# Patient Record
Sex: Female | Born: 1945 | Race: White | Hispanic: No | Marital: Married | Smoking: Former smoker
Health system: Southern US, Community
[De-identification: ages and names within clinical notes are randomized; demographics above are authoritative.]

---

## 2014-12-04 DIAGNOSIS — H353290 Exudative age-related macular degeneration, unspecified eye, stage unspecified: Secondary | ICD-10-CM | POA: Insufficient documentation

## 2014-12-04 DIAGNOSIS — H3530 Unspecified macular degeneration: Secondary | ICD-10-CM | POA: Insufficient documentation

## 2014-12-04 DIAGNOSIS — H35059 Retinal neovascularization, unspecified, unspecified eye: Secondary | ICD-10-CM | POA: Insufficient documentation

## 2015-08-04 DIAGNOSIS — Z85118 Personal history of other malignant neoplasm of bronchus and lung: Secondary | ICD-10-CM | POA: Insufficient documentation

## 2015-08-04 DIAGNOSIS — Z720 Tobacco use: Secondary | ICD-10-CM | POA: Insufficient documentation

## 2015-08-04 DIAGNOSIS — Z9289 Personal history of other medical treatment: Secondary | ICD-10-CM | POA: Insufficient documentation

## 2015-08-04 DIAGNOSIS — J209 Acute bronchitis, unspecified: Secondary | ICD-10-CM | POA: Insufficient documentation

## 2015-08-04 DIAGNOSIS — I480 Paroxysmal atrial fibrillation: Secondary | ICD-10-CM | POA: Insufficient documentation

## 2015-08-04 DIAGNOSIS — J9611 Chronic respiratory failure with hypoxia: Secondary | ICD-10-CM | POA: Insufficient documentation

## 2015-08-04 DIAGNOSIS — I509 Heart failure, unspecified: Secondary | ICD-10-CM | POA: Insufficient documentation

## 2015-08-04 DIAGNOSIS — R7989 Other specified abnormal findings of blood chemistry: Secondary | ICD-10-CM | POA: Insufficient documentation

## 2015-08-04 DIAGNOSIS — A419 Sepsis, unspecified organism: Secondary | ICD-10-CM | POA: Insufficient documentation

## 2015-08-05 DIAGNOSIS — J441 Chronic obstructive pulmonary disease with (acute) exacerbation: Secondary | ICD-10-CM | POA: Insufficient documentation

## 2015-08-05 DIAGNOSIS — E039 Hypothyroidism, unspecified: Secondary | ICD-10-CM | POA: Insufficient documentation

## 2015-08-05 DIAGNOSIS — J449 Chronic obstructive pulmonary disease, unspecified: Secondary | ICD-10-CM | POA: Insufficient documentation

## 2015-08-10 DIAGNOSIS — R197 Diarrhea, unspecified: Secondary | ICD-10-CM | POA: Insufficient documentation

## 2015-08-10 DIAGNOSIS — R531 Weakness: Secondary | ICD-10-CM | POA: Insufficient documentation

## 2015-08-10 DIAGNOSIS — E860 Dehydration: Secondary | ICD-10-CM | POA: Insufficient documentation

## 2015-08-10 DIAGNOSIS — B370 Candidal stomatitis: Secondary | ICD-10-CM | POA: Insufficient documentation

## 2015-08-10 DIAGNOSIS — J9621 Acute and chronic respiratory failure with hypoxia: Secondary | ICD-10-CM | POA: Insufficient documentation

## 2015-08-10 DIAGNOSIS — R110 Nausea: Secondary | ICD-10-CM | POA: Insufficient documentation

## 2015-08-15 DIAGNOSIS — J441 Chronic obstructive pulmonary disease with (acute) exacerbation: Secondary | ICD-10-CM | POA: Insufficient documentation

## 2015-08-16 DIAGNOSIS — J111 Influenza due to unidentified influenza virus with other respiratory manifestations: Secondary | ICD-10-CM | POA: Insufficient documentation

## 2015-08-25 DIAGNOSIS — K5901 Slow transit constipation: Secondary | ICD-10-CM | POA: Insufficient documentation

## 2015-08-25 DIAGNOSIS — N179 Acute kidney failure, unspecified: Secondary | ICD-10-CM | POA: Insufficient documentation

## 2015-09-16 DIAGNOSIS — I480 Paroxysmal atrial fibrillation: Secondary | ICD-10-CM | POA: Insufficient documentation

## 2015-10-31 DIAGNOSIS — Z9981 Dependence on supplemental oxygen: Secondary | ICD-10-CM | POA: Insufficient documentation

## 2015-11-05 DIAGNOSIS — C3490 Malignant neoplasm of unspecified part of unspecified bronchus or lung: Secondary | ICD-10-CM | POA: Insufficient documentation

## 2015-11-05 DIAGNOSIS — E782 Mixed hyperlipidemia: Secondary | ICD-10-CM | POA: Insufficient documentation

## 2015-11-05 DIAGNOSIS — R52 Pain, unspecified: Secondary | ICD-10-CM | POA: Insufficient documentation

## 2015-11-06 DIAGNOSIS — S22000A Wedge compression fracture of unspecified thoracic vertebra, initial encounter for closed fracture: Secondary | ICD-10-CM | POA: Insufficient documentation

## 2015-11-12 ENCOUNTER — Other Ambulatory Visit (HOSPITAL_COMMUNITY): Payer: Self-pay | Admitting: Interventional Radiology

## 2015-11-12 DIAGNOSIS — S22000A Wedge compression fracture of unspecified thoracic vertebra, initial encounter for closed fracture: Secondary | ICD-10-CM

## 2015-12-05 DIAGNOSIS — M549 Dorsalgia, unspecified: Secondary | ICD-10-CM | POA: Insufficient documentation

## 2015-12-05 DIAGNOSIS — S22080A Wedge compression fracture of T11-T12 vertebra, initial encounter for closed fracture: Secondary | ICD-10-CM | POA: Insufficient documentation

## 2015-12-10 ENCOUNTER — Other Ambulatory Visit: Payer: Self-pay

## 2016-01-02 ENCOUNTER — Other Ambulatory Visit: Payer: Medicare Other

## 2016-01-02 ENCOUNTER — Ambulatory Visit
Admission: RE | Admit: 2016-01-02 | Discharge: 2016-01-02 | Disposition: A | Discharge status: Home / Self Care | Payer: Medicare Other | Source: Ambulatory Visit | Attending: Internal Medicine | Admitting: Internal Medicine

## 2016-01-02 ENCOUNTER — Other Ambulatory Visit: Payer: Self-pay | Admitting: Internal Medicine

## 2016-01-02 ENCOUNTER — Ambulatory Visit
Admission: RE | Admit: 2016-01-02 | Discharge: 2016-01-02 | Disposition: A | Discharge status: Home / Self Care | Payer: Self-pay | Source: Ambulatory Visit | Attending: Interventional Radiology | Admitting: Interventional Radiology

## 2016-01-02 DIAGNOSIS — M5440 Lumbago with sciatica, unspecified side: Secondary | ICD-10-CM

## 2016-01-02 DIAGNOSIS — S22000A Wedge compression fracture of unspecified thoracic vertebra, initial encounter for closed fracture: Secondary | ICD-10-CM

## 2016-01-02 HISTORY — PX: IR GENERIC HISTORICAL: IMG1180011

## 2016-01-02 MED ORDER — IOPAMIDOL (ISOVUE-M 200) INJECTION 41%
1.0000 mL | Freq: Once | INTRAMUSCULAR | Status: AC
  Administered 2016-01-02: 1 mL via EPIDURAL

## 2016-01-02 MED ORDER — METHYLPREDNISOLONE ACETATE 40 MG/ML INJ SUSP (RADIOLOG
120.0000 mg | Freq: Once | INTRAMUSCULAR | Status: AC
  Administered 2016-01-02: 120 mg via EPIDURAL

## 2016-01-02 NOTE — Progress Notes (Signed)
Patient ID: Tanya Roberts, female   DOB: 1945/10/25, 70 y.o.   MRN: [id number]       Chief Complaint:  post T7 and T12 kyphoplasty.  Referring Physician(s): [place]  History of Present Illness: Tanya Roberts is a 70 y.o. female with past family history significant for severe COPD, CAD and presumed lung cancer who underwent a successful fluoroscopic guided vertebral body biopsy cement augmentation of T7 on 11/07/2015 and T12  12/10/2015.  Tanya Roberts returns today for post procedural evaluation and management. Tanya Roberts is accompanied by Tanya Roberts husband though serves as Tanya Roberts own historian.  I relayed to the patient that biopsies of both the T7 and T12 vertebral bodies were negative for malignancy at either location.  Patient states that Tanya Roberts has experienced a complete recovery of Tanya Roberts fracture related pain following vertebral body augmentation of both these locations.  Unfortunately, the patient continues to experience persistent more lower back pain which Tanya Roberts attributes to a fall and twisting injury Tanya Roberts experienced earlier this year. Patient states that Tanya Roberts pain is 0 out of 10 at rest though worsens to at least 7 out of 10 with any movement or activity and is worsened with prolonged standing.   Patient denies change in bowel or bladder function. No fever or chills.  The patient has not experienced a recent fall or injury.  No past medical history on file.  No past surgical history on file.  Allergies: Morphine and related; Codeine; Aleve [naproxen sodium]; Daliresp [roflumilast]; Gabapentin; and Rocephin [ceftriaxone]  Medications: Prior to Admission medications   Not on File     No family history on file.  Social History   Social History  . Marital status: Married    Spouse name: N/A  . Number of children: N/A  . Years of education: N/A   Social History Main Topics  . Smoking status: Not on file  . Smokeless tobacco: Not on file  . Alcohol use Not on file  . Drug use: Unknown  . Sexual activity:  Not on file   Other Topics Concern  . Not on file   Social History Narrative  . No narrative on file    ECOG Status: 2 - Symptomatic, <50% confined to bed  Review of Systems: A 12 point ROS discussed and pertinent positives are indicated in the HPI above.  All other systems are negative.  Review of Systems  Constitutional: Negative for activity change and appetite change.  Respiratory: Positive for shortness of breath.        Baseline shortness of breath.  Cardiovascular: Negative.   Musculoskeletal: Positive for back pain.    Vital Signs: BP 105/71 (BP Location: Right Arm, Patient Position: Sitting, Cuff Size: Normal)   Pulse (!) 108   Temp 98.1 F (36.7 C)   Resp (!) 22   Ht '5\' 5"'$  (1.651 m)   Wt 140 lb (63.5 kg)   SpO2 97% Comment: room air  BMI 23.30 kg/m   Physical Exam  Constitutional: Tanya Roberts appears well-developed and well-nourished.  HENT:  Head: Normocephalic and atraumatic.  Cardiovascular: Normal rate and regular rhythm.   Pulmonary/Chest:  Crackles and wheezes within the bilateral lung bases.  Musculoskeletal:       Lumbar back: Tanya Roberts exhibits pain.       Arms: Well healed augmentation access sites.    Imaging: No results found.  Labs:  CBC: No results for input(s): WBC, HGB, HCT, PLT in the last 8760 hours.  COAGS: No results for input(s): INR, APTT in the  last 8760 hours.  BMP: No results for input(s): NA, K, CL, CO2, GLUCOSE, BUN, CALCIUM, CREATININE, GFRNONAA, GFRAA in the last 8760 hours.  Invalid input(s): CMP  LIVER FUNCTION TESTS: No results for input(s): BILITOT, AST, ALT, ALKPHOS, PROT, ALBUMIN in the last 8760 hours.  TUMOR MARKERS: No results for input(s): AFPTM, CEA, CA199, CHROMGRNA in the last 8760 hours.  Assessment and Plan:  Tanya Roberts is a 70 y.o. female with past family history significant for severe COPD, CAD and presumed lung cancer (Tanya Roberts has refused biopsy or treatment) who underwent a successful fluoroscopic guided  vertebral body biopsy cement augmentation of T7 on 11/07/2015 and T12 on 12/10/2015.    Fortunately, the patient has experienced a near complete resolution of Tanya Roberts fracture related pain following augmentation of the T7 and T12 vertebral bodies.  Unfortunately, the patient continues to experience persistent lower back pain which Tanya Roberts attributes to a fall and twisting injury Tanya Roberts experienced earlier this year.   Based on patient request, I will attempt to coordinate for the patient to undergo evaluation for potential epidural steroid injection.  The patient the patient's husband were encouraged to call the interventional radiology clinic if Tanya Roberts were to experience any pain which mimicked Tanya Roberts compression fracture related back pain as because Tanya Roberts has experienced 2 of these fractures, Tanya Roberts is at risk for future compression fractures.  A copy of this report was sent to the requesting provider on this date.  Electronically Signed: Sandi Mariscal 01/02/2016, 12:08 PM   I spent a total of 15 Minutes in face to face in clinical consultation, greater than 50% of which was counseling/coordinating care for symptomatic compression fracture, following vertebral body augmentation.

## 2016-01-02 NOTE — Discharge Instructions (Signed)

## 2016-01-17 ENCOUNTER — Other Ambulatory Visit: Payer: Self-pay | Admitting: Internal Medicine

## 2016-01-17 DIAGNOSIS — G8929 Other chronic pain: Secondary | ICD-10-CM

## 2016-01-17 DIAGNOSIS — M545 Low back pain, unspecified: Secondary | ICD-10-CM

## 2016-01-29 ENCOUNTER — Ambulatory Visit
Admission: RE | Admit: 2016-01-29 | Discharge: 2016-01-29 | Disposition: A | Discharge status: Home / Self Care | Payer: Medicare Other | Source: Ambulatory Visit | Attending: Internal Medicine | Admitting: Internal Medicine

## 2016-01-29 DIAGNOSIS — M545 Low back pain, unspecified: Secondary | ICD-10-CM

## 2016-01-29 DIAGNOSIS — G8929 Other chronic pain: Secondary | ICD-10-CM

## 2016-01-29 MED ORDER — IOPAMIDOL (ISOVUE-M 200) INJECTION 41%
1.0000 mL | Freq: Once | INTRAMUSCULAR | Status: AC
  Administered 2016-01-29: 1 mL via EPIDURAL

## 2016-01-29 MED ORDER — METHYLPREDNISOLONE ACETATE 40 MG/ML INJ SUSP (RADIOLOG
120.0000 mg | Freq: Once | INTRAMUSCULAR | Status: AC
  Administered 2016-01-29: 120 mg via EPIDURAL

## 2016-02-07 ENCOUNTER — Other Ambulatory Visit: Payer: Self-pay | Admitting: Interventional Radiology

## 2016-02-07 DIAGNOSIS — S22000A Wedge compression fracture of unspecified thoracic vertebra, initial encounter for closed fracture: Secondary | ICD-10-CM

## 2016-02-11 ENCOUNTER — Ambulatory Visit
Admission: RE | Admit: 2016-02-11 | Discharge: 2016-02-11 | Disposition: A | Discharge status: Home / Self Care | Payer: Medicare Other | Source: Ambulatory Visit | Attending: Interventional Radiology | Admitting: Interventional Radiology

## 2016-02-11 DIAGNOSIS — S22000A Wedge compression fracture of unspecified thoracic vertebra, initial encounter for closed fracture: Secondary | ICD-10-CM

## 2016-02-11 HISTORY — PX: IR GENERIC HISTORICAL: IMG1180011

## 2016-02-11 NOTE — Progress Notes (Signed)
Chief Complaint: I have another spine fracture.  Referring Physician(s): Dr. Clovia Cuff, [place]  History of Present Illness: Tanya Roberts is a 70 y.o. female presenting to vascular and interventional radiology clinic today with new onset back pain.  She has previously been referred to our service by her physician Dr. Rolene Course. She has a known history of vertebral compression fracture.  Her first treatment was performed 11/07/2015 at the T7 level. She subsequently developed a T12 compression fracture which was confirmed with MRI. T12 was treated 12/10/2015.  She tells me she had acute onset of lower back pain on Thursday and Friday of last week which prompted an emergency department visit. X-ray performed 02/08/2016 demonstrates a new L4 compression fracture. Of note, on prior MR imaging there is no evidence of underlying lesion at the L4 level, with usual marrow signal. I think there is a low likelihood of pathologic fracture.  At this time she is experiencing 8/10, 9/10 pain intensity, which is unremitting. She has been taking Vicodin which makes her nauseated and gives her constipation. She would prefer not to be taking as much medicine. She is unable to perform her tasks as well as she had after the previous 2 treatments.  She had a good result from her prior to treatments.  Currently she denies any fevers writers or chills. No symptoms of urinary tract infection. No symptoms of pneumonia. She was recently treated with a course of Z-Pak and Levaquin for her COPD. She has no ongoing symptoms which have resolved.   Allergies: Morphine and related; Codeine; Neuromuscular blocking agents; Aleve [naproxen sodium]; Daliresp [roflumilast]; Gabapentin; and Rocephin [ceftriaxone]  Medications: Prior to Admission medications   Medication Sig Start Date End Date Taking? Authorizing Provider  Aclidinium Bromide 400 MCG/ACT AEPB Inhale into the lungs.    Historical Provider, MD    albuterol (PROVENTIL HFA;VENTOLIN HFA) 108 (90 Base) MCG/ACT inhaler Inhale into the lungs. 08/13/15 08/12/16  Historical Provider, MD  albuterol (PROVENTIL) (2.5 MG/3ML) 0.083% nebulizer solution 2.5 mg. 08/28/15 08/27/16  Historical Provider, MD  aspirin EC 81 MG tablet Take 81 mg by mouth.    Historical Provider, MD  atorvastatin (LIPITOR) 40 MG tablet Take 40 mg by mouth.    Historical Provider, MD  bisacodyl (DULCOLAX) 10 MG suppository Place 10 mg rectally.    Historical Provider, MD  diltiazem (CARDIZEM CD) 180 MG 24 hr capsule Take 180 mg by mouth.    Historical Provider, MD  Fluticasone-Salmeterol (ADVAIR) 500-50 MCG/DOSE AEPB Inhale into the lungs.    Historical Provider, MD  HYDROcodone-acetaminophen (NORCO) 10-325 MG tablet Take by mouth.    Historical Provider, MD  levothyroxine (SYNTHROID, LEVOTHROID) 88 MCG tablet Take by mouth.    Historical Provider, MD  methocarbamol (ROBAXIN) 750 MG tablet Take 750 mg by mouth.    Historical Provider, MD  omega-3 acid ethyl esters (LOVAZA) 1 g capsule Take by mouth.    Historical Provider, MD  predniSONE (DELTASONE) 10 MG tablet Take by mouth.    Historical Provider, MD  promethazine (PHENERGAN) 12.5 MG tablet Take 12.5 mg by mouth.    Historical Provider, MD     No family history on file.  Social History   Social History  . Marital status: Married    Spouse name: N/A  . Number of children: N/A  . Years of education: N/A   Social History Main Topics  . Smoking status: Former Smoker    Years: 65.00    Types: Cigarettes  Quit date: 08/11/2015  . Smokeless tobacco: Never Used  . Alcohol use No  . Drug use: Unknown  . Sexual activity: Not Asked   Other Topics Concern  . None   Social History Narrative  . None     Review of Systems: A 12 point ROS discussed and pertinent positives are indicated in the HPI above.  All other systems are negative.  Review of Systems  Vital Signs: BP 139/79 (BP Location: Right Arm, Patient  Position: Sitting, Cuff Size: Normal)   Pulse 84   Temp 98 F (36.7 C)   Resp (!) 22   Wt 136 lb (61.7 kg)   SpO2 93%   BMI 22.63 kg/m   Physical Exam  Atraumatic normocephalic. Mucous membranes moist pink. Auscultation of the lungs with coarse lung sounds. Regular rate and rhythm. Tender to palpation in the low back overlying L4 level. No tenderness palpating the upper previously treated levels. Nontender abdomen. Genitourinary deferred. Moving all 4 extremities grossly with gross motor or gross sensory intact.  Mallampati Score:     Imaging: Dg Inject Diag/thera/inc Needle/cath/plc Epi/lumb/sac W/img  Result Date: 01/29/2016 CLINICAL DATA:  Lumbosacral spondylosis without myelopathy. Low back pain, right worse than left. No leg pain. Partial though relatively transient relief after the L3-4 epidural injection last month. FLUOROSCOPY TIME:  Radiation Exposure Index (as provided by the fluoroscopic device): 66.36 microGray*m^2 Fluoroscopy Time (in minutes and seconds):  15 seconds PROCEDURE: The procedure, risks, benefits, and alternatives were explained to the patient. Questions regarding the procedure were encouraged and answered. The patient understands and consents to the procedure. LUMBAR EPIDURAL INJECTION: An interlaminar approach was performed on the right at L4-5. The overlying skin was cleansed and anesthetized. A 3.5 inch 20 gauge epidural needle was advanced using loss-of-resistance technique. DIAGNOSTIC EPIDURAL INJECTION: Injection of Isovue-M 200 shows a good epidural pattern with spread above and below the level of needle placement, bilaterally but greater to the right. No vascular opacification is seen. THERAPEUTIC EPIDURAL INJECTION: 120 mg of Depo-Medrol mixed with 3 mL of 1% lidocaine were instilled. The procedure was well-tolerated, and the patient was discharged thirty minutes following the injection in good condition. COMPLICATIONS: None IMPRESSION: Technically  successful lumbar interlaminar epidural injection on the right at L4-5. Electronically Signed   By: Logan Bores M.D.   On: 01/29/2016 13:33    Labs:  CBC: No results for input(s): WBC, HGB, HCT, PLT in the last 8760 hours.  COAGS: No results for input(s): INR, APTT in the last 8760 hours.  BMP: No results for input(s): NA, K, CL, CO2, GLUCOSE, BUN, CALCIUM, CREATININE, GFRNONAA, GFRAA in the last 8760 hours.  Invalid input(s): CMP  LIVER FUNCTION TESTS: No results for input(s): BILITOT, AST, ALT, ALKPHOS, PROT, ALBUMIN in the last 8760 hours.  TUMOR MARKERS: No results for input(s): AFPTM, CEA, CA199, CHROMGRNA in the last 8760 hours.  Assessment and Plan:  Ms Kobler is a 70 year old female with a history of prior compression fracture of T7 and T12, now with a new L4 compression fracture.  She is a candidate for vertebral augmentation. I think there is a low likelihood of this representing a pathologic fracture given the absence of abnormal marrow signal on prior recent MRI. I think it is reasonable to proceed with augmentation.  I discussed with her goals of therapy as well as briefly the risks, of which she is well aware. These would be bleeding, infection, local injury, nerve injury, ongoing pain, cardiopulmonary collapse, death. She is  keen to proceed, as she has had good result on her prior to episodes. She prefers [facility] as site of treatment.     Electronically Signed: Corrie Mckusick 02/11/2016, 9:49 AM   I spent a total of    15 Minutes in face to face in clinical consultation, greater than 50% of which was counseling/coordinating care for compression fracture of L4, possible vertebral augmentation.

## 2016-02-12 ENCOUNTER — Other Ambulatory Visit: Payer: Self-pay | Admitting: Internal Medicine

## 2016-02-12 DIAGNOSIS — M545 Low back pain, unspecified: Secondary | ICD-10-CM

## 2016-02-12 DIAGNOSIS — G8929 Other chronic pain: Secondary | ICD-10-CM

## 2016-02-24 ENCOUNTER — Other Ambulatory Visit (HOSPITAL_COMMUNITY): Payer: Self-pay | Admitting: Interventional Radiology

## 2016-02-24 DIAGNOSIS — S22000A Wedge compression fracture of unspecified thoracic vertebra, initial encounter for closed fracture: Secondary | ICD-10-CM

## 2016-03-26 ENCOUNTER — Other Ambulatory Visit: Payer: Medicare Other

## 2016-04-08 ENCOUNTER — Other Ambulatory Visit: Payer: Medicare Other

## 2016-04-30 ENCOUNTER — Ambulatory Visit
Admission: RE | Admit: 2016-04-30 | Discharge: 2016-04-30 | Disposition: A | Discharge status: Home / Self Care | Payer: Medicare Other | Source: Ambulatory Visit | Attending: Interventional Radiology | Admitting: Interventional Radiology

## 2016-04-30 ENCOUNTER — Other Ambulatory Visit: Payer: Self-pay | Admitting: *Deleted

## 2016-04-30 DIAGNOSIS — S32030A Wedge compression fracture of third lumbar vertebra, initial encounter for closed fracture: Secondary | ICD-10-CM

## 2016-04-30 DIAGNOSIS — S22000A Wedge compression fracture of unspecified thoracic vertebra, initial encounter for closed fracture: Secondary | ICD-10-CM

## 2016-04-30 HISTORY — PX: IR GENERIC HISTORICAL: IMG1180011

## 2016-04-30 NOTE — Progress Notes (Signed)
Chief Complaint: Patient was seen in consultation today for No chief complaint on file.  at the request of Tanya Roberts  Referring Physician(s): Tanya Roberts  History of Present Illness: Tanya Roberts is a 70 y.o. female who is 3 weeks status post kyphoplasty at T10, T11, and L5. She did well post procedure but fell again one week ago and now has new onset midline lumbar pain. She denies any weakness or numbness in her legs. The pain is across her back.  No past medical history on file.  No past surgical history on file.  Allergies: Morphine and related; Codeine; Neuromuscular blocking agents; Succinylcholine; Aleve [naproxen sodium]; Daliresp [roflumilast]; Gabapentin; and Rocephin [ceftriaxone]  Medications: Prior to Admission medications   Medication Sig Start Date End Date Taking? Authorizing Provider  Aclidinium Bromide 400 MCG/ACT AEPB Inhale into the lungs.    Historical Provider, MD  albuterol (PROVENTIL HFA;VENTOLIN HFA) 108 (90 Base) MCG/ACT inhaler Inhale into the lungs. 08/13/15 08/12/16  Historical Provider, MD  albuterol (PROVENTIL) (2.5 MG/3ML) 0.083% nebulizer solution 2.5 mg. 08/28/15 08/27/16  Historical Provider, MD  aspirin EC 81 MG tablet Take 81 mg by mouth.    Historical Provider, MD  atorvastatin (LIPITOR) 40 MG tablet Take 40 mg by mouth.    Historical Provider, MD  bisacodyl (DULCOLAX) 10 MG suppository Place 10 mg rectally.    Historical Provider, MD  diltiazem (CARDIZEM CD) 180 MG 24 hr capsule Take 180 mg by mouth.    Historical Provider, MD  Fluticasone-Salmeterol (ADVAIR) 500-50 MCG/DOSE AEPB Inhale into the lungs.    Historical Provider, MD  HYDROcodone-acetaminophen (NORCO) 10-325 MG tablet Take by mouth.    Historical Provider, MD  levothyroxine (SYNTHROID, LEVOTHROID) 88 MCG tablet Take by mouth.    Historical Provider, MD  methocarbamol (ROBAXIN) 750 MG tablet Take 750 mg by mouth.    Historical Provider, MD  omega-3 acid ethyl esters (LOVAZA) 1 g  capsule Take by mouth.    Historical Provider, MD  predniSONE (DELTASONE) 10 MG tablet Take by mouth.    Historical Provider, MD  promethazine (PHENERGAN) 12.5 MG tablet Take 12.5 mg by mouth.    Historical Provider, MD     No family history on file.  Social History   Social History  . Marital status: Married    Spouse name: N/A  . Number of children: N/A  . Years of education: N/A   Social History Main Topics  . Smoking status: Former Smoker    Years: 65.00    Types: Cigarettes    Quit date: 08/11/2015  . Smokeless tobacco: Never Used  . Alcohol use No  . Drug use: Unknown  . Sexual activity: Not on file   Other Topics Concern  . Not on file   Social History Narrative  . No narrative on file     Review of Systems: A 12 point ROS discussed and pertinent positives are indicated in the HPI above.  All other systems are negative.  Review of Systems  Vital Signs: BP 113/74 (BP Location: Right Arm, Patient Position: Sitting, Cuff Size: Normal)   Pulse (!) 103   Temp 98.1 F (36.7 C)   Resp (!) 22   Wt 131 lb (59.4 kg)   LMP 03/31/2016   SpO2 97% Comment: O2 @ 2L/[place]  BMI 21.80 kg/m   Physical Exam  Constitutional: She is oriented to person, place, and time. She appears well-developed and well-nourished.  Pulmonary/Chest: Effort normal.  Musculoskeletal:  Diffuse tenderness across her lumbar spine.  Neurological: She is alert and oriented to person, place, and time.    Mallampati Score: 2    Imaging: No results found.  Labs:  CBC: No results for input(s): WBC, HGB, HCT, PLT in the last 8760 hours.  COAGS: No results for input(s): INR, APTT in the last 8760 hours.  BMP: No results for input(s): NA, K, CL, CO2, GLUCOSE, BUN, CALCIUM, CREATININE, GFRNONAA, GFRAA in the last 8760 hours.  Invalid input(s): CMP  LIVER FUNCTION TESTS: No results for input(s): BILITOT, AST, ALT, ALKPHOS, PROT, ALBUMIN in the last 8760 hours.  TUMOR MARKERS: No results  for input(s): AFPTM, CEA, CA199, CHROMGRNA in the last 8760 hours.  Assessment and Plan:  Ms. Tanya Roberts did well after her kyphoplasty procedure, but unfortunately has fallen again and has new onset pain. She may have a new lumbar spine fracture. MRI of the lumbar spine is to follow.    Electronically Signed: Rubin Roberts, ART A 04/30/2016, 2:18 PM   I spent a total of   15 Minutes in face to face in clinical consultation, greater than 50% of which was counseling/coordinating care for kyphoplasty. Patient ID: Tanya Roberts, female   DOB: 1945-07-01, 70 y.o.   MRN: [id number]

## 2016-05-19 ENCOUNTER — Other Ambulatory Visit (HOSPITAL_COMMUNITY): Payer: Self-pay | Admitting: Interventional Radiology

## 2016-05-19 DIAGNOSIS — S22000A Wedge compression fracture of unspecified thoracic vertebra, initial encounter for closed fracture: Secondary | ICD-10-CM

## 2016-05-28 ENCOUNTER — Encounter: Payer: Self-pay | Admitting: Interventional Radiology

## 2016-05-29 ENCOUNTER — Encounter: Payer: Self-pay | Admitting: Interventional Radiology

## 2016-06-09 ENCOUNTER — Ambulatory Visit
Admission: RE | Admit: 2016-06-09 | Discharge: 2016-06-09 | Disposition: A | Discharge status: Home / Self Care | Payer: Medicare Other | Source: Ambulatory Visit | Attending: Interventional Radiology | Admitting: Interventional Radiology

## 2016-06-09 ENCOUNTER — Encounter: Payer: Self-pay | Admitting: *Deleted

## 2016-06-09 DIAGNOSIS — S22000A Wedge compression fracture of unspecified thoracic vertebra, initial encounter for closed fracture: Secondary | ICD-10-CM

## 2016-06-09 HISTORY — PX: IR GENERIC HISTORICAL: IMG1180011

## 2016-06-09 NOTE — Progress Notes (Signed)
Patient ID: Tanya Roberts, female   DOB: Oct 16, 1945, 71 y.o.   MRN: [id number]       Referring Physician(s): Jessice Madill  History of Present Illness: Tanya Roberts is a 71 y.o. female who recently underwent an L1 kyphoplasty. Her back pain is markedly improved. Presently, she has some achiness at her lumbosacral junction. She denies any recent falls and is ambulatory. She denies any numbness or weakness. She is weaning her narcotics. She is no longer taking a fentanyl patch and she is only taking 2 narcotic pills per day.  No past medical history on file.  Past Surgical History:  Procedure Laterality Date  . IR GENERIC HISTORICAL  01/02/2016   IR RADIOLOGIST EVAL & MGMT 01/02/2016 Sandi Mariscal, MD [facility] INTERV RAD  . IR GENERIC HISTORICAL  02/11/2016   IR RADIOLOGIST EVAL & MGMT 02/11/2016 Corrie Mckusick, DO [facility] INTERV RAD  . IR GENERIC HISTORICAL  04/30/2016   IR RADIOLOGIST EVAL & MGMT 04/30/2016 Marybelle Killings, MD [facility] INTERV RAD     Allergies: Morphine and related; Codeine; Neuromuscular blocking agents; Succinylcholine; Aleve [naproxen sodium]; Daliresp [roflumilast]; Gabapentin; and Rocephin [ceftriaxone]  Medications: Prior to Admission medications   Medication Sig Start Date End Date Taking? Authorizing Provider  Aclidinium Bromide 400 MCG/ACT AEPB Inhale into the lungs.   Yes Historical Provider, MD  albuterol (PROVENTIL HFA;VENTOLIN HFA) 108 (90 Base) MCG/ACT inhaler Inhale into the lungs. 08/13/15 08/12/16 Yes Historical Provider, MD  albuterol (PROVENTIL) (2.5 MG/3ML) 0.083% nebulizer solution 2.5 mg. 08/28/15 08/27/16 Yes Historical Provider, MD  aspirin EC 81 MG tablet Take 81 mg by mouth.   Yes Historical Provider, MD  atorvastatin (LIPITOR) 40 MG tablet Take 40 mg by mouth.   Yes Historical Provider, MD  bisacodyl (DULCOLAX) 10 MG suppository Place 10 mg rectally.   Yes Historical Provider, MD  diltiazem (CARDIZEM CD) 180 MG 24 hr capsule Take 180 mg by mouth.   Yes Historical  Provider, MD  Fluticasone-Salmeterol (ADVAIR) 500-50 MCG/DOSE AEPB Inhale into the lungs.   Yes Historical Provider, MD  HYDROcodone-acetaminophen (NORCO) 10-325 MG tablet Take by mouth.   Yes Historical Provider, MD  levofloxacin (LEVAQUIN) 500 MG tablet Take 500 mg by mouth daily.   Yes Historical Provider, MD  levothyroxine (SYNTHROID, LEVOTHROID) 88 MCG tablet Take by mouth.   Yes Historical Provider, MD  methocarbamol (ROBAXIN) 750 MG tablet Take 750 mg by mouth.   Yes Historical Provider, MD  omega-3 acid ethyl esters (LOVAZA) 1 g capsule Take by mouth.   Yes Historical Provider, MD  predniSONE (DELTASONE) 10 MG tablet Take by mouth.   Yes Historical Provider, MD  promethazine (PHENERGAN) 12.5 MG tablet Take 12.5 mg by mouth.   Yes Historical Provider, MD     No family history on file.  Social History   Social History  . Marital status: Married    Spouse name: N/A  . Number of children: N/A  . Years of education: N/A   Social History Main Topics  . Smoking status: Former Smoker    Years: 65.00    Types: Cigarettes    Quit date: 08/11/2015  . Smokeless tobacco: Never Used  . Alcohol use No  . Drug use: Unknown  . Sexual activity: Not on file   Other Topics Concern  . Not on file   Social History Narrative  . No narrative on file     Review of Systems: A 12 point ROS discussed and pertinent positives are indicated in the HPI above.  All  other systems are negative.  Review of Systems  Vital Signs: BP 107/75 (BP Location: Left Arm, Patient Position: Sitting, Cuff Size: Normal)   Pulse (!) 102   Temp 97.7 F (36.5 C) (Oral)   Resp 17   Ht '5\' 5"'$  (1.651 m)   Wt 132 lb (59.9 kg)   SpO2 98% Comment: O2 at 2L/nasal cannula  BMI 21.97 kg/m   Physical Exam  Constitutional: She is oriented to person, place, and time. She appears well-developed and well-nourished.  Musculoskeletal:  No lumbar back tenderness. No deformity.  Neurological: She is alert and oriented to  person, place, and time.     Imaging: No results found.    Labs:  CBC: No results for input(s): WBC, HGB, HCT, PLT in the last 8760 hours.  COAGS: No results for input(s): INR, APTT in the last 8760 hours.  BMP: No results for input(s): NA, K, CL, CO2, GLUCOSE, BUN, CALCIUM, CREATININE, GFRNONAA, GFRAA in the last 8760 hours.  Invalid input(s): CMP  LIVER FUNCTION TESTS: No results for input(s): BILITOT, AST, ALT, ALKPHOS, PROT, ALBUMIN in the last 8760 hours.  TUMOR MARKERS: No results for input(s): AFPTM, CEA, CA199, CHROMGRNA in the last 8760 hours.  Assessment and Plan:  Tanya Roberts has undergone successful L1 kyphoplasty without any new fracture. Her pain is markedly improved.  Electronically Signed: Izaiha Lo, ART A 06/09/2016, 2:02 PM   I spent a total of   5 Minutes in face to face in clinical consultation, greater than 50% of which was counseling/coordinating care for kyphoplasty.

## 2016-08-17 ENCOUNTER — Other Ambulatory Visit: Payer: Self-pay | Admitting: Internal Medicine

## 2016-08-17 DIAGNOSIS — M545 Low back pain, unspecified: Secondary | ICD-10-CM

## 2016-08-17 DIAGNOSIS — M455 Ankylosing spondylitis of thoracolumbar region: Secondary | ICD-10-CM

## 2016-08-23 ENCOUNTER — Other Ambulatory Visit: Payer: Medicare Other

## 2016-09-01 ENCOUNTER — Other Ambulatory Visit (HOSPITAL_COMMUNITY): Payer: Self-pay | Admitting: Interventional Radiology

## 2016-09-01 DIAGNOSIS — S22000A Wedge compression fracture of unspecified thoracic vertebra, initial encounter for closed fracture: Secondary | ICD-10-CM

## 2016-09-24 ENCOUNTER — Ambulatory Visit
Admission: RE | Admit: 2016-09-24 | Discharge: 2016-09-24 | Disposition: A | Discharge status: Home / Self Care | Payer: Medicare Other | Source: Ambulatory Visit | Attending: Interventional Radiology | Admitting: Interventional Radiology

## 2016-09-24 DIAGNOSIS — S22000A Wedge compression fracture of unspecified thoracic vertebra, initial encounter for closed fracture: Secondary | ICD-10-CM

## 2016-09-24 HISTORY — PX: IR RADIOLOGIST EVAL & MGMT: IMG5224

## 2016-09-24 NOTE — Progress Notes (Signed)
Referring Physician(s):  Dr. Verlin Fester  Chief Complaint: The patient is seen in follow up today s/p kyphoplasty   History of present illness: Tanya Roberts is a 71 year old female who is known to IR service for her history of several compression fractures which have been amendable to kyphoplasty.  Her most recent kyphoplasty was on 08/26/16 with Dr. Vernard Gambles.  Patient presents to IR clinic today for follow-up of her procedure.   Patient reports she has experienced some reduction in her pain, but not complete resolve. Her pain in is the same place and she does not feel there are any new fractures. She continues to take hydrocodone as needed for her back pain and feels most comfortable when recycling or laying flat. She has been able to ambulate with assistance from her walker, although she notes she has progressively gotten weaker due to deconditioning.  She has started physical therapy by order of her PCP.  She has no new concerns today.   No past medical history on file.  Past Surgical History:  Procedure Laterality Date  . IR GENERIC HISTORICAL  01/02/2016   IR RADIOLOGIST EVAL & MGMT 01/02/2016 Sandi Mariscal, MD [facility]  . IR GENERIC HISTORICAL  02/11/2016   IR RADIOLOGIST EVAL & MGMT 02/11/2016 Corrie Mckusick, DO [facility]  . IR GENERIC HISTORICAL  04/30/2016   IR RADIOLOGIST EVAL & MGMT 04/30/2016 Marybelle Killings, MD [facility]  . IR GENERIC HISTORICAL  06/09/2016   IR RADIOLOGIST EVAL & MGMT 06/09/2016 Marybelle Killings, MD [facility]    Allergies: Morphine and related; Codeine; Neuromuscular blocking agents; Succinylcholine; Aleve [naproxen sodium]; Daliresp [roflumilast]; Gabapentin; and Rocephin [ceftriaxone]  Medications: Prior to Admission medications   Medication Sig Start Date End Date Taking? Authorizing Provider  Aclidinium Bromide 400 MCG/ACT AEPB Inhale into the lungs.   Yes Historical Provider, MD  aspirin EC 81 MG tablet Take 81 mg by mouth.   Yes  Historical Provider, MD  atorvastatin (LIPITOR) 40 MG tablet Take 40 mg by mouth.   Yes Historical Provider, MD  bisacodyl (DULCOLAX) 10 MG suppository Place 10 mg rectally.   Yes Historical Provider, MD  diltiazem (CARDIZEM CD) 180 MG 24 hr capsule Take 180 mg by mouth.   Yes Historical Provider, MD  Fluticasone-Salmeterol (ADVAIR) 500-50 MCG/DOSE AEPB Inhale into the lungs.   Yes Historical Provider, MD  HYDROcodone-acetaminophen (NORCO) 10-325 MG tablet Take by mouth.   Yes Historical Provider, MD  levothyroxine (SYNTHROID, LEVOTHROID) 88 MCG tablet Take by mouth.   Yes Historical Provider, MD  predniSONE (DELTASONE) 10 MG tablet Take by mouth.   Yes Historical Provider, MD  promethazine (PHENERGAN) 12.5 MG tablet Take 12.5 mg by mouth.   Yes Historical Provider, MD  albuterol (PROVENTIL HFA;VENTOLIN HFA) 108 (90 Base) MCG/ACT inhaler Inhale into the lungs. 08/13/15 08/12/16  Historical Provider, MD  albuterol (PROVENTIL) (2.5 MG/3ML) 0.083% nebulizer solution 2.5 mg. 08/28/15 08/27/16  Historical Provider, MD  levofloxacin (LEVAQUIN) 500 MG tablet Take 500 mg by mouth daily.    Historical Provider, MD  methocarbamol (ROBAXIN) 750 MG tablet Take 750 mg by mouth.    Historical Provider, MD  omega-3 acid ethyl esters (LOVAZA) 1 g capsule Take by mouth.    Historical Provider, MD     No family history on file.  Social History   Social History  . Marital status: Married    Spouse name: N/A  . Number of children: N/A  . Years of education:  N/A   Social History Main Topics  . Smoking status: Former Smoker    Years: 65.00    Types: Cigarettes    Quit date: 08/11/2015  . Smokeless tobacco: Never Used  . Alcohol use No  . Drug use: Unknown  . Sexual activity: Not on file   Other Topics Concern  . Not on file   Social History Narrative  . No narrative on file     Vital Signs: BP (!) 120/58   Pulse 81   Temp 98.1 F (36.7 C) (Oral)   Resp 14   Ht '5\' 5"'$  (1.651 m)   Wt 137 lb (62.1  kg)   SpO2 98% Comment: O2 at 2L/Nasal Cannula  BMI 22.80 kg/m   Physical Exam  Alert, NAD Resp: Coarse breath sounds, on continuous O2 Heart: regular rhythm Skin: procedure site healed  Imaging: No results found.  Labs:  CBC: No results for input(s): WBC, HGB, HCT, PLT in the last 8760 hours.  COAGS: No results for input(s): INR, APTT in the last 8760 hours.  BMP: No results for input(s): NA, K, CL, CO2, GLUCOSE, BUN, CALCIUM, CREATININE, GFRNONAA, GFRAA in the last 8760 hours.  Invalid input(s): CMP  LIVER FUNCTION TESTS: No results for input(s): BILITOT, AST, ALT, ALKPHOS, PROT, ALBUMIN in the last 8760 hours.  Assessment: Kyphoplasty of T8 08/26/16 Patient returns to clinic for follow-up after kyphoplasty at T8. She reports an estimated 50% reduction in her pain immediately and is overall feeling better. She denies any new pain or variation in location of pain today. She continues to take hydrocodone twice daily to help with pain control.  Discussed results of her procedure and expected course given her current amount of pain control.  Patient verbalizes understanding.  She will call with any questions, concerns, or new pain.  She plans to continue hydrocodone, physical therapy, and may use some ibuprofen to help with pain and inflammation.   Signed: Docia Barrier 09/24/2016, 2:56 PM   Please refer to Dr. Vernard Gambles attestation of this note for management and plan.

## 2016-11-06 ENCOUNTER — Encounter: Payer: Self-pay | Admitting: Student

## 2017-04-24 DEATH — deceased

## 2017-07-11 IMAGING — XA Imaging study
2 series · 2 of 2 positions shown · non-contrast
Comparison: none

CLINICAL DATA: Lumbosacral spondylosis without myelopathy. Low back
pain, right worse than left. No leg pain. Partial though relatively
transient relief after the L3-4 epidural injection last month.

[Series 2: ortho standard · 1 of 1 slices shown (1 of 2)]
[im 1/1]
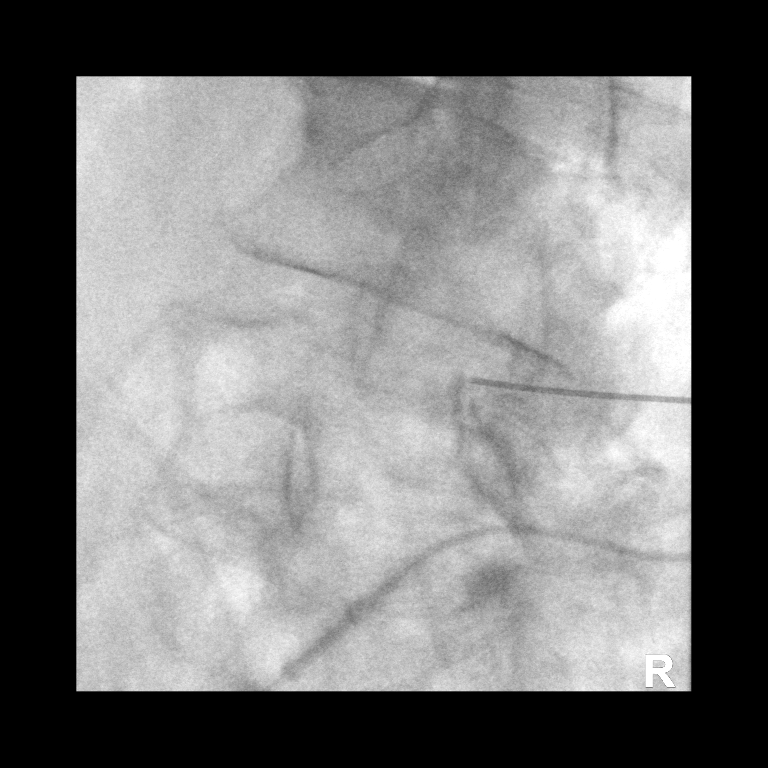

[Series 3: ortho standard · 1 of 1 slices shown (2 of 2)]
[im 1/1]
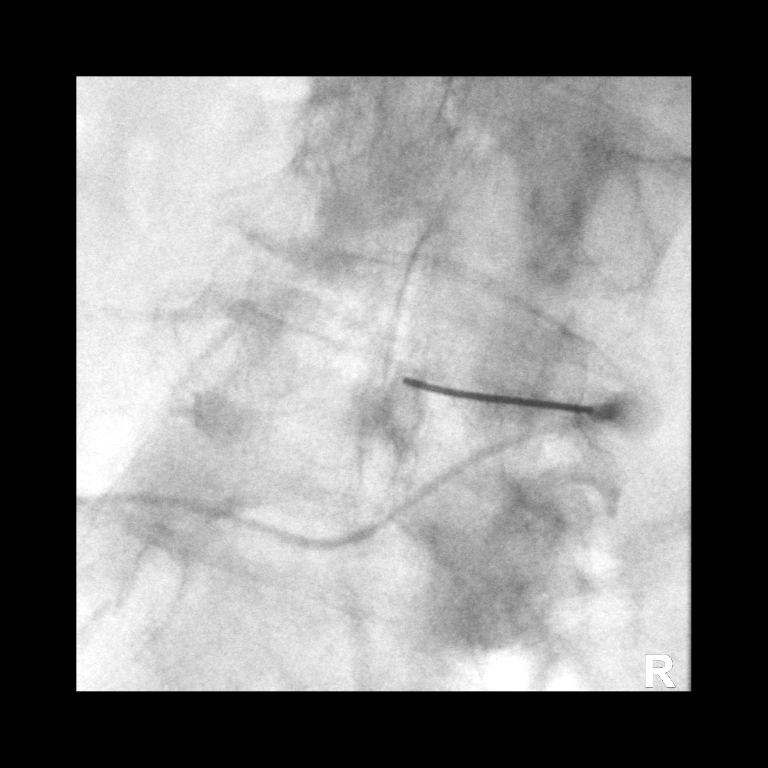

[2 of 2 positions shown; findings below may reference images not displayed]

FLUOROSCOPY TIME:  Radiation Exposure Index (as provided by the
fluoroscopic device): 66.36 microGray*m^2

Fluoroscopy Time (in minutes and seconds):  15 seconds

PROCEDURE:
The procedure, risks, benefits, and alternatives were explained to
the patient. Questions regarding the procedure were encouraged and
answered. The patient understands and consents to the procedure.

LUMBAR EPIDURAL INJECTION:

An interlaminar approach was performed on the right at L4-5. The
overlying skin was cleansed and anesthetized. A 3.5 inch 20 gauge
epidural needle was advanced using loss-of-resistance technique.

DIAGNOSTIC EPIDURAL INJECTION:

Injection of Isovue-M 200 shows a good epidural pattern with spread
above and below the level of needle placement, bilaterally but
greater to the right. No vascular opacification is seen.

THERAPEUTIC EPIDURAL INJECTION:

120 mg of Depo-Medrol mixed with 3 mL of 1% lidocaine were
instilled. The procedure was well-tolerated, and the patient was
discharged thirty minutes following the injection in good condition.

COMPLICATIONS:
None
IMPRESSION: Technically successful lumbar interlaminar epidural injection on the
right at L4-5.
